# Patient Record
Sex: Male | Born: 1990 | Race: White | Hispanic: No | Marital: Single | Smoking: Never smoker
Health system: Southern US, Community
[De-identification: ages and names within clinical notes are randomized; demographics above are authoritative.]

---

## 2018-06-24 ENCOUNTER — Other Ambulatory Visit: Payer: Self-pay

## 2018-06-24 ENCOUNTER — Emergency Department
Admission: EM | Admit: 2018-06-24 | Discharge: 2018-06-24 | Disposition: A | Discharge status: Other Acute Inpatient Hospital | Payer: Self-pay | Attending: Emergency Medicine | Admitting: Emergency Medicine

## 2018-06-24 ENCOUNTER — Emergency Department: Payer: Self-pay

## 2018-06-24 ENCOUNTER — Encounter: Payer: Self-pay | Admitting: Emergency Medicine

## 2018-06-24 DIAGNOSIS — R52 Pain, unspecified: Secondary | ICD-10-CM

## 2018-06-24 DIAGNOSIS — S80212A Abrasion, left knee, initial encounter: Secondary | ICD-10-CM | POA: Insufficient documentation

## 2018-06-24 DIAGNOSIS — Y929 Unspecified place or not applicable: Secondary | ICD-10-CM | POA: Insufficient documentation

## 2018-06-24 DIAGNOSIS — Y9351 Activity, roller skating (inline) and skateboarding: Secondary | ICD-10-CM | POA: Insufficient documentation

## 2018-06-24 DIAGNOSIS — Y999 Unspecified external cause status: Secondary | ICD-10-CM | POA: Insufficient documentation

## 2018-06-24 DIAGNOSIS — S82142A Displaced bicondylar fracture of left tibia, initial encounter for closed fracture: Secondary | ICD-10-CM | POA: Insufficient documentation

## 2018-06-24 LAB — CBC WITH DIFFERENTIAL/PLATELET
BASOS ABS: 0 10*3/uL (ref 0–0.1)
BASOS PCT: 0 %
EOS ABS: 0 10*3/uL (ref 0–0.7)
Eosinophils Relative: 0 %
HCT: 40.1 % (ref 40.0–52.0)
Hemoglobin: 13.9 g/dL (ref 13.0–18.0)
Lymphocytes Relative: 7 %
Lymphs Abs: 1.2 10*3/uL (ref 1.0–3.6)
MCH: 32 pg (ref 26.0–34.0)
MCHC: 34.6 g/dL (ref 32.0–36.0)
MCV: 92.5 fL (ref 80.0–100.0)
MONOS PCT: 8 %
Monocytes Absolute: 1.4 10*3/uL — ABNORMAL HIGH (ref 0.2–1.0)
NEUTROS ABS: 14.7 10*3/uL — AB (ref 1.4–6.5)
Neutrophils Relative %: 85 %
Platelets: 253 10*3/uL (ref 150–440)
RBC: 4.33 MIL/uL — ABNORMAL LOW (ref 4.40–5.90)
RDW: 13.2 % (ref 11.5–14.5)
WBC: 17.3 10*3/uL — ABNORMAL HIGH (ref 3.8–10.6)

## 2018-06-24 LAB — BASIC METABOLIC PANEL
ANION GAP: 10 (ref 5–15)
BUN: 12 mg/dL (ref 6–20)
CALCIUM: 9.1 mg/dL (ref 8.9–10.3)
CO2: 21 mmol/L — AB (ref 22–32)
CREATININE: 0.87 mg/dL (ref 0.61–1.24)
Chloride: 108 mmol/L (ref 98–111)
GFR calc Af Amer: >60 mL/min (ref >60)
Glucose, Bld: 106 mg/dL — ABNORMAL HIGH (ref 70–99)
Potassium: 3.6 mmol/L (ref 3.5–5.1)
SODIUM: 139 mmol/L (ref 135–145)

## 2018-06-24 MED ORDER — ONDANSETRON HCL 4 MG/2ML IJ SOLN
4.0000 mg | Freq: Once | INTRAMUSCULAR | Status: AC
  Administered 2018-06-24: 4 mg via INTRAVENOUS
  Filled 2018-06-24: qty 2

## 2018-06-24 MED ORDER — HYDROMORPHONE HCL 1 MG/ML IJ SOLN
0.5000 mg | Freq: Once | INTRAMUSCULAR | Status: AC
  Administered 2018-06-24: 0.5 mg via INTRAVENOUS
  Filled 2018-06-24: qty 1

## 2018-06-24 MED ORDER — SODIUM CHLORIDE 0.9 % IV BOLUS
1000.0000 mL | Freq: Once | INTRAVENOUS | Status: AC
  Administered 2018-06-24: 1000 mL via INTRAVENOUS

## 2018-06-24 MED ORDER — HYDROMORPHONE HCL 1 MG/ML IJ SOLN
1.0000 mg | Freq: Once | INTRAMUSCULAR | Status: AC
  Administered 2018-06-24: 1 mg via INTRAVENOUS
  Filled 2018-06-24: qty 1

## 2018-06-24 MED ORDER — HYDROMORPHONE HCL 1 MG/ML IJ SOLN
1.0000 mg | Freq: Once | INTRAMUSCULAR | Status: DC
  Filled 2018-06-24: qty 1

## 2018-06-24 MED ORDER — MORPHINE SULFATE (PF) 4 MG/ML IV SOLN
4.0000 mg | Freq: Once | INTRAVENOUS | Status: AC
  Administered 2018-06-24: 4 mg via INTRAVENOUS
  Filled 2018-06-24: qty 1

## 2018-06-24 MED ORDER — SODIUM CHLORIDE 0.9 % IV BOLUS: 1000.0000 mL | Freq: Once | INTRAVENOUS | Status: DC

## 2018-06-24 MED ORDER — FENTANYL CITRATE (PF) 100 MCG/2ML IJ SOLN
50.0000 ug | Freq: Once | INTRAMUSCULAR | Status: AC
  Administered 2018-06-24: 50 ug via INTRAVENOUS
  Filled 2018-06-24: qty 2

## 2018-06-24 NOTE — ED Notes (Signed)
Called [facility] for transport  1550

## 2018-06-24 NOTE — ED Notes (Signed)
Called CT, kristen, to [facility] images  1524 

## 2018-06-24 NOTE — ED Provider Notes (Signed)
[facility] Emergency Department Provider Note ____________________________________________   I have reviewed the triage vital signs and the triage nursing note.  HISTORY  Chief Complaint Knee Injury and Fall   Historian Patient  HPI Gary Curtis is a 27 y.o. male presents after skateboarding accident landing on left knee, pain is severe at the left knee, unable to walk.  No head or neck injury.  No chest pain or trouble breathing.  Some abrasions to the arms.  No other bony injury.  Happened just prior to arrival.  Arrived by EMS.  Received pain medicine which helped somewhat, but still in significant/severe pain.     History reviewed. No pertinent past medical history.  There are no active problems to display for this patient.   History reviewed. No pertinent surgical history.  Prior to Admission medications   Not on File    No Known Allergies  No family history on file.  Social History Social History   Tobacco Use  . Smoking status: Never Smoker  . Smokeless tobacco: Never Used  Substance Use Topics  . Alcohol use: Not on file  . Drug use: Yes    Types: Marijuana    Review of Systems  Constitutional: Negative for fever. Eyes: Negative for visual changes. ENT: Negative for sore throat. Cardiovascular: Negative for chest pain. Respiratory: Negative for shortness of breath. Gastrointestinal: Negative for abdominal pain, vomiting and diarrhea. Genitourinary: Negative for dysuria. Musculoskeletal: Negative for back pain.  Negative for neck pain.  Positive for left knee pain as per HPI. Skin: Negative for rash. Neurological: Negative for headache.  ____________________________________________   PHYSICAL EXAM:  VITAL SIGNS: ED Triage Vitals  Enc Vitals Group     BP      Pulse      Resp      Temp      Temp src      SpO2      Weight      Height      Head Circumference      Peak Flow      Pain Score      Pain Loc       Pain Edu?      Excl. in GC?      Constitutional: Alert and oriented.  HEENT      Head: Normocephalic and atraumatic.      Eyes: Conjunctivae are normal. Pupils equal and round.       Ears:         Nose: No congestion/rhinnorhea.      Mouth/Throat: Mucous membranes are moist.      Neck: No stridor. Cardiovascular/Chest: Normal rate, regular rhythm.  No murmurs, rubs, or gallops. Respiratory: Normal respiratory effort without tachypnea nor retractions. Breath sounds are clear and equal bilaterally. No wheezes/rales/rhonchi. Gastrointestinal: Soft. No distention, no guarding, no rebound. Nontender.    Genitourinary/rectal:Deferred Musculoskeletal: Pelvis stable.  Left knee and splint.  Abrasion over the top of the left knee.  Swelling and tenderness around the anterior tibia area without significant deformity.  Neurovascularly intact in left lower extremity.  Sensation intact. Neurologic:  Normal speech and language. No gross or focal neurologic deficits are appreciated. Skin:  Skin is warm, dry and intact. No rash noted. Psychiatric: Mood and affect are normal. Speech and behavior are normal. Patient exhibits appropriate insight and judgment.   ____________________________________________  LABS (pertinent positives/negatives) I, [name], MD the attending physician have reviewed the labs noted below.  Labs Reviewed  BASIC METABOLIC PANEL -  Abnormal; Notable for the following components:      Result Value   CO2 21 (*)    Glucose, Bld 106 (*)    All other components within normal limits  CBC WITH DIFFERENTIAL/PLATELET - Abnormal; Notable for the following components:   WBC 17.3 (*)    RBC 4.33 (*)    Neutro Abs 14.7 (*)    Monocytes Absolute 1.4 (*)    All other components within normal limits    ____________________________________________    EKG I, Governor Rooksebecca Nil Bolser, MD, the attending physician have personally viewed and interpreted all  ECGs.  None ____________________________________________  RADIOLOGY   Left knee complete, viewed by myself, radiologist interpretation reviewed: Left tibial plateau fracture  CT left knee: IMPRESSION: 1. Severely comminuted lateral tibial plateau fracture with 3.4 cm right-left distraction and 3.7 cm anterior-posterior distraction at the articular surface. Lateral femoral condyle is impacted into the lateral tibial plateau with mild lateral subluxation of tibia. Fracture involves the medial and lateral tibial eminence. Posterolateral joint capsule appears injured. 2. Recommend MRI of the left knee for evaluation of the ligamentous structures. __________________________________________  PROCEDURES  Procedure(s) performed:  Splint applied by ED tech and nurse.  Location left leg.  Left in position of comfort with EMS posterior splint and padding/buttress laterally to help with stability.  Neurovascularly intact before and after splinting, checked by myself.  Procedures  Critical Care performed: None   ____________________________________________  ED COURSE / ASSESSMENT AND PLAN  Pertinent labs & imaging results that were available during my care of the patient were reviewed by me and considered in my medical decision making (see chart for details).    Concern for fracture about the left knee clinically, provided IV pain control.  X-ray confirms comminuted and displaced proximal tibia fracture with comminuted tibial plateau component.  No additional concern for traumatic injuries.  Obtaining CT scan for further evaluation.  CT scan shows very comminuted fracture with lateral and medial tibial plateaus affected.  After discussion with Dr. Ernest PineHooten here in the emergency department reviewing the films, he is concerned the patient may need fixation and trauma/Ortho for this complicated fix.  Patient required repeat doses of IV pain medicine.  Brief periods of hypotension after  pain medication I suspect are related to that.  He has had no abdominal pain.  No chest pain.  Does not report any other injuries.   CONSULTATIONS: Orthopedics Dr. Ernest PineHooten - recommends CT for further eval. [facility]hl[facility] ED [facility].  I spoke with Dr. Lavonia DraftsScholer, who accepts in transfer ED to ED.   Patient / Family / Caregiver informed of clinical course, medical decision-making process, and agree with plan.    ___________________________________________   FINAL CLINICAL IMPRESSION(S) / ED DIAGNOSES   Final diagnoses:  Abrasion, left knee, initial encounter  Tibial plateau fracture, left, closed, initial encounter      ___________________________________________         Note: This dictation was prepared with Dragon dictation. Any transcriptional errors that result from this process are unintentional    Governor RooksLord, Moet Mikulski, MD 06/24/18 1550

## 2018-06-24 NOTE — ED Notes (Signed)
Per Dr. Shaune PollackLord, hold dilaudid for now until BP can improve, give fentanyl and start fluids.

## 2018-06-24 NOTE — ED Triage Notes (Signed)
Pt arrived via EMS s/p skateboard accident falling onto left knee.  Deformity present.  Pt has splint in place from EMS  Pt had fentanyl total with EMS and fluid bolus started.

## 2018-06-24 NOTE — ED Notes (Signed)
Splint applied to left knee by Lorin PicketScott EDT, used gauze and arm splints for peds and adults to mobilize knee, splint from EMS maintained.  Pulses and sensation intact post splint application.  Dr. Shaune PollackLord in room to visualize splint after application.

## 2018-06-24 NOTE — ED Notes (Signed)
Pt sweating and c/o nausea and pain, family worried about patient's blood pressure, reassured pt and family blood pressure is being closely monitored and pt will be getting fluid.  Tried to reposition pt and make him more comfortable.  VSS discussed with Dr. Shaune PollackLord.

## 2018-06-24 NOTE — ED Notes (Signed)
Returned from CT.

## 2018-06-24 NOTE — ED Notes (Signed)
Patient transported to CT 

## 2018-06-24 NOTE — ED Notes (Signed)
Called CT, kristen, to [employer] [facility]  1524

## 2019-06-04 IMAGING — CT CT KNEE*L* W/O CM
3 of 4 series · 14 of 33 positions shown, 17 images · non-contrast
Comparison: None.

CLINICAL DATA: Skateboarding accident.  Left knee pain.

EXAM:
CT OF THE LEFT KNEE WITHOUT CONTRAST
TECHNIQUE: Multidetector CT imaging of the LEFT knee was performed according to
the standard protocol. Multiplanar CT image reconstructions were
also generated.

[Series 5: axial st · axial · 0.51mm/px · z∈[+375,+540]mm · 8 of 130 slices shown, 10 images]
[im 10/130  soft-tissue]
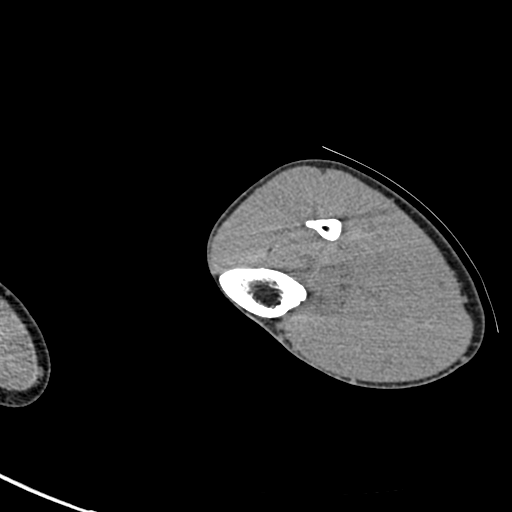
[im 10/130  bone]
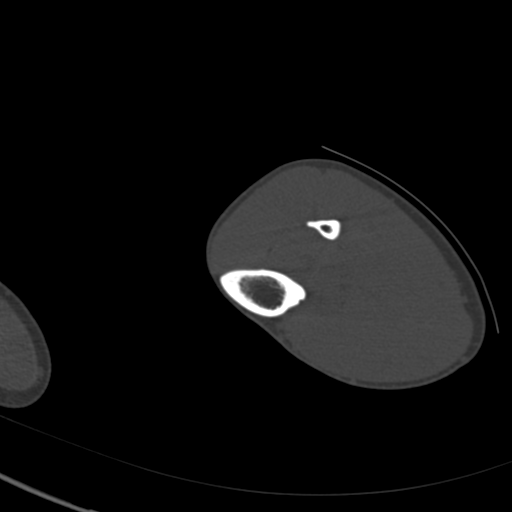
[im 30/130  bone]
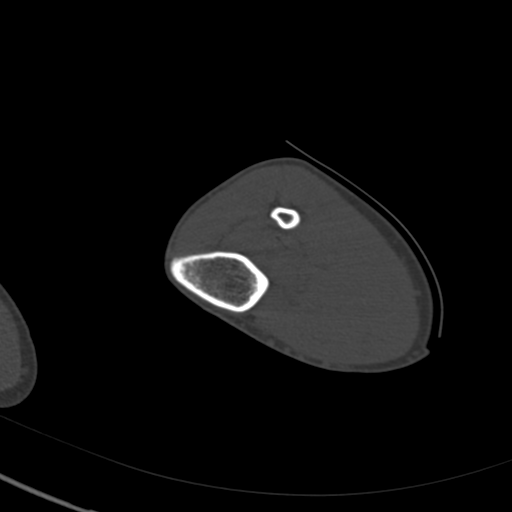
[im 40/130  bone]
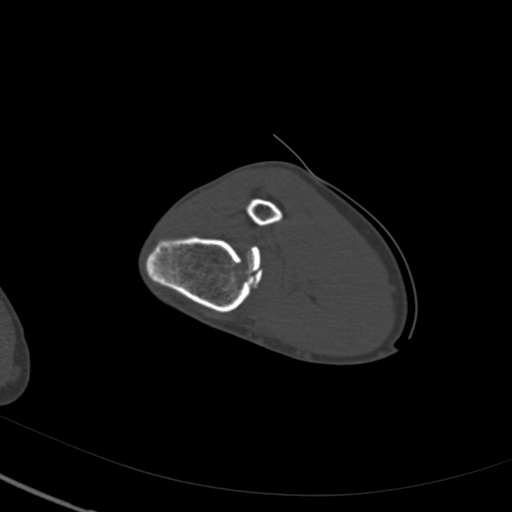
[im 60/130  bone]
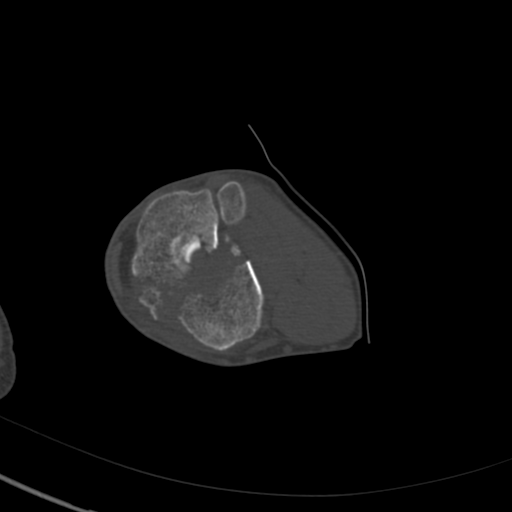
[im 70/130  soft-tissue]
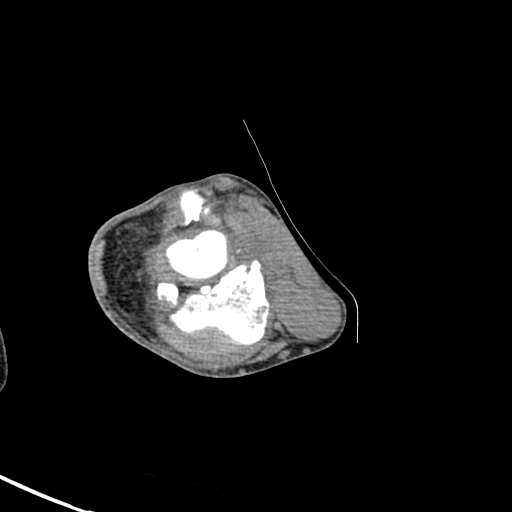
[im 70/130  bone]
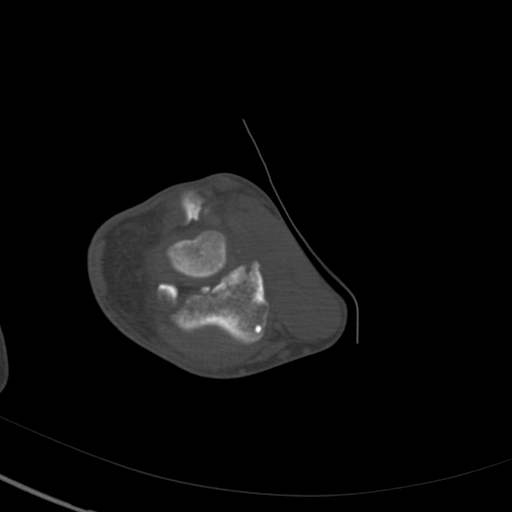
[im 90/130  bone]
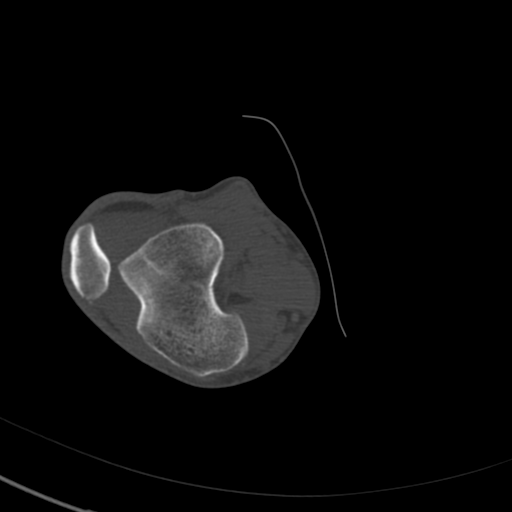
[im 100/130  bone]
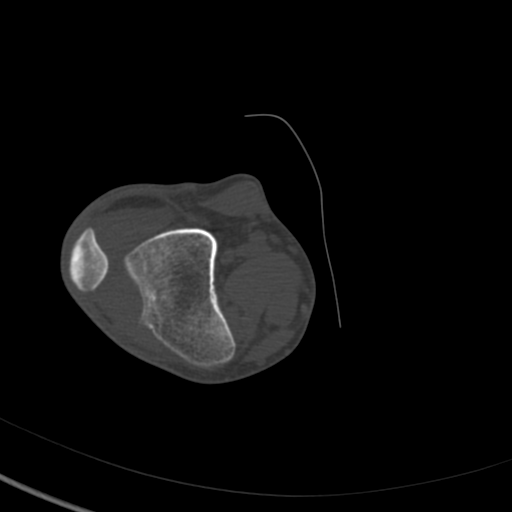
[im 120/130  bone]
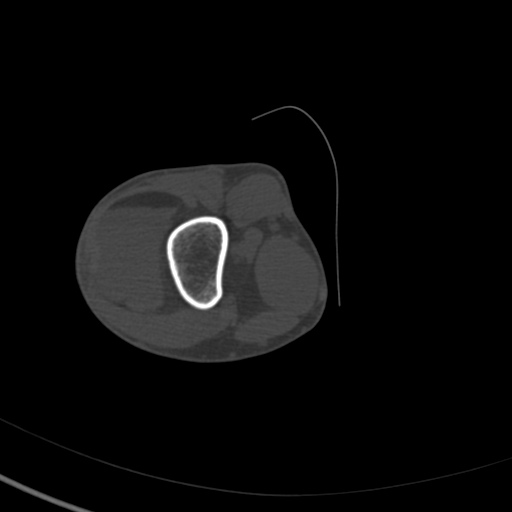

[Series 6: coronal bone · coronal · 0.30mm/px · 1 of 87 slices shown]
[im 44/87  bone]
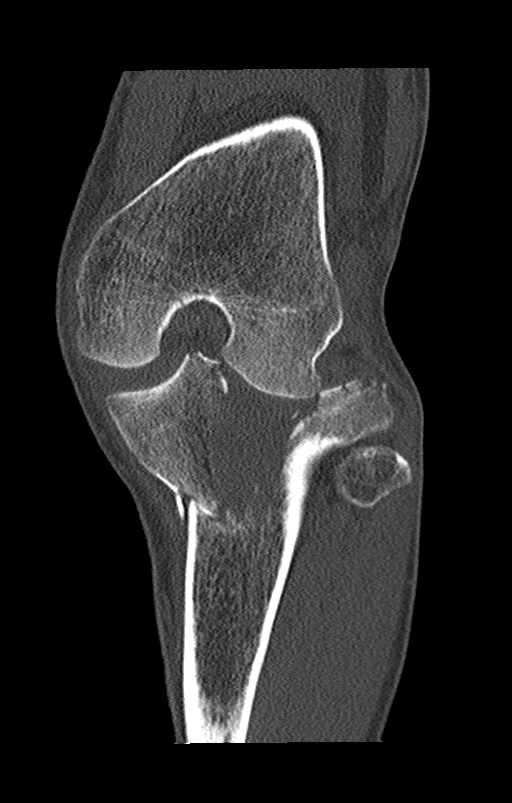

[Series 9: sagittal st · sagittal · 0.43mm/px · 5 of 78 slices shown, 6 images]
[im 26/78  bone]
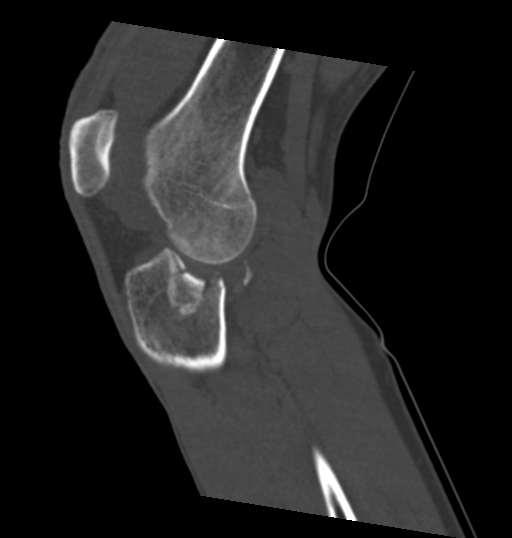
[im 33/78  bone]
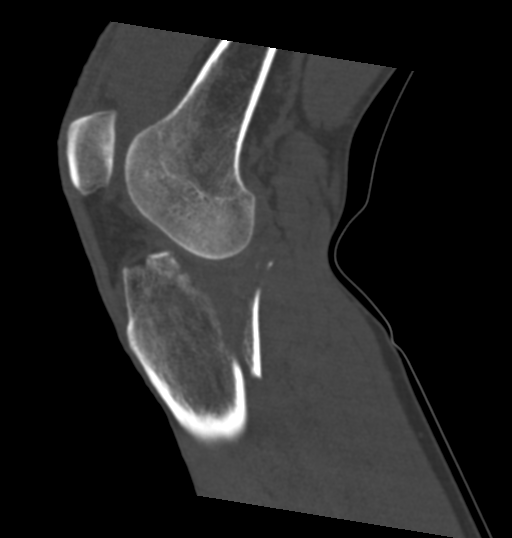
[im 39/78  soft-tissue]
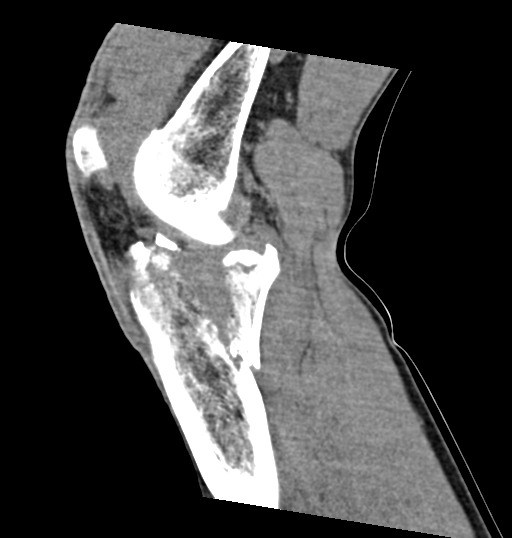
[im 39/78  bone]
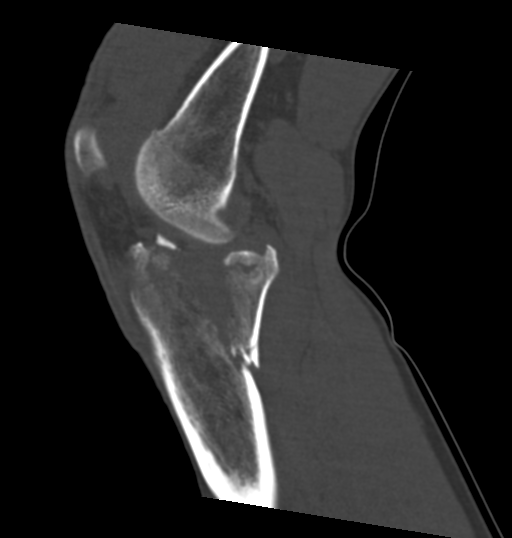
[im 45/78  bone]
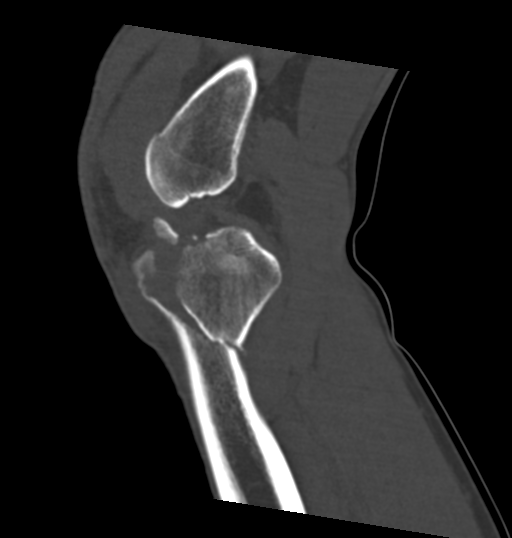
[im 52/78  bone]
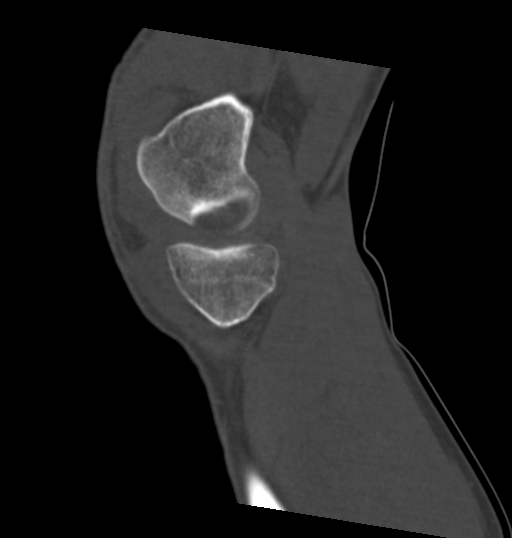

[14 of 33 positions shown; findings below may reference images not displayed]

FINDINGS: Bones/Joint/Cartilage

Severely comminuted lateral tibial plateau fracture with 3.4 cm
right-left distraction and 3.7 cm anterior-posterior distraction at
the articular surface. Lateral femoral condyle is impacted into the
lateral tibial plateau with mild lateral subluxation of tibia.
Fracture involves the medial and lateral tibial eminence.
Posterolateral joint capsule appears injured.

No other acute fracture or dislocation.  Large hemarthrosis.

No aggressive osseous lesion.

Ligaments

Suboptimally assessed by CT.

Muscles and Tendons

Muscles are normal.  No muscle atrophy.

Soft tissues

No soft tissue mass.  No fluid collection or hematoma.
IMPRESSION: 1. Severely comminuted lateral tibial plateau fracture with 3.4 cm
right-left distraction and 3.7 cm anterior-posterior distraction at
the articular surface. Lateral femoral condyle is impacted into the
lateral tibial plateau with mild lateral subluxation of tibia.
Fracture involves the medial and lateral tibial eminence.
Posterolateral joint capsule appears injured.
2. Recommend MRI of the left knee for evaluation of the ligamentous
structures.

## 2019-06-04 IMAGING — DX DG KNEE 1-2V*L*
2 series · 2 of 2 positions shown · non-contrast
Comparison: None.

CLINICAL DATA: Fell today with knee pain and deformity.

EXAM:
LEFT KNEE - 1-2 VIEW

[knee ap]
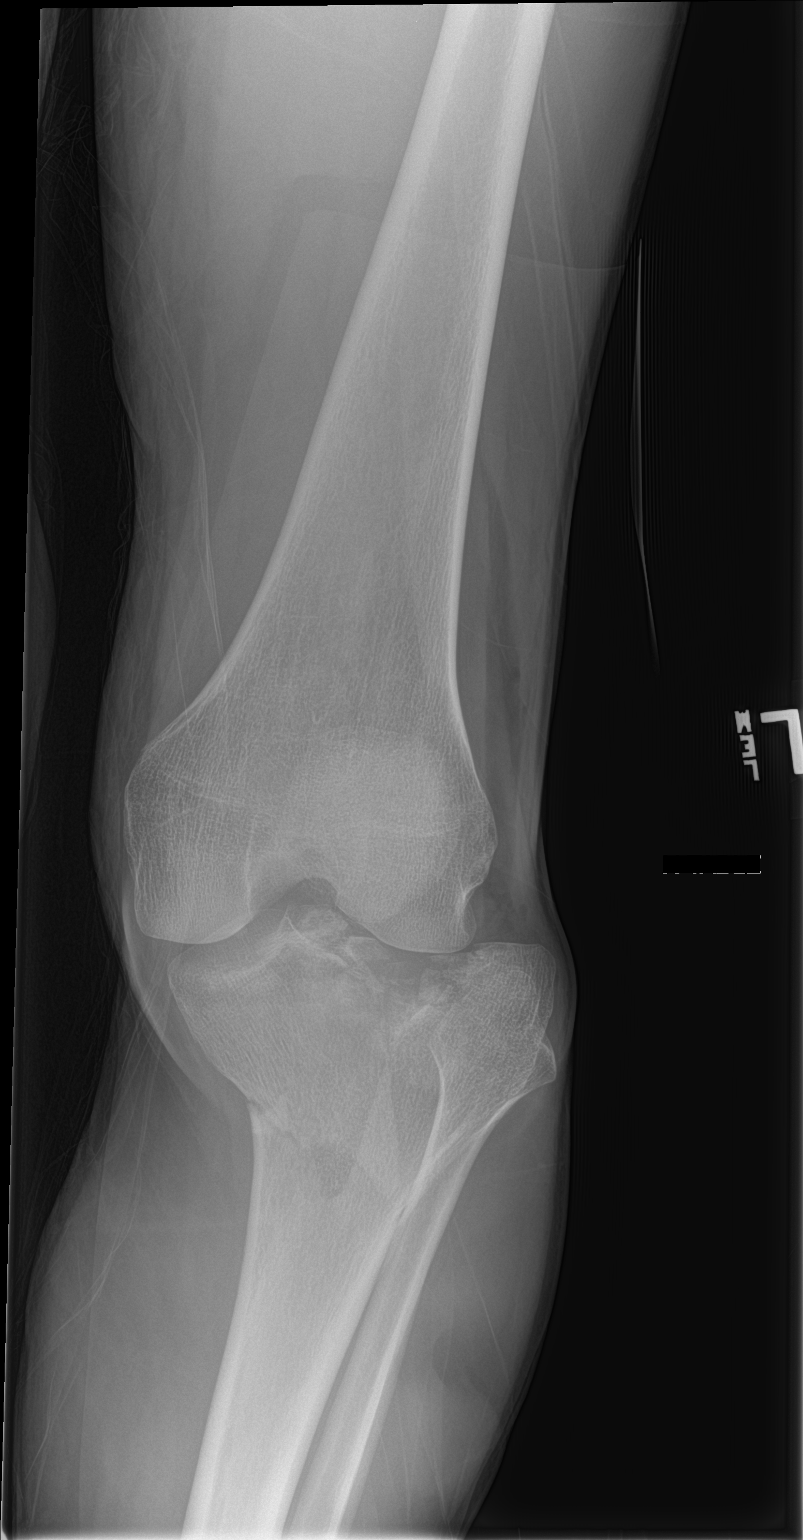

[knee lat]
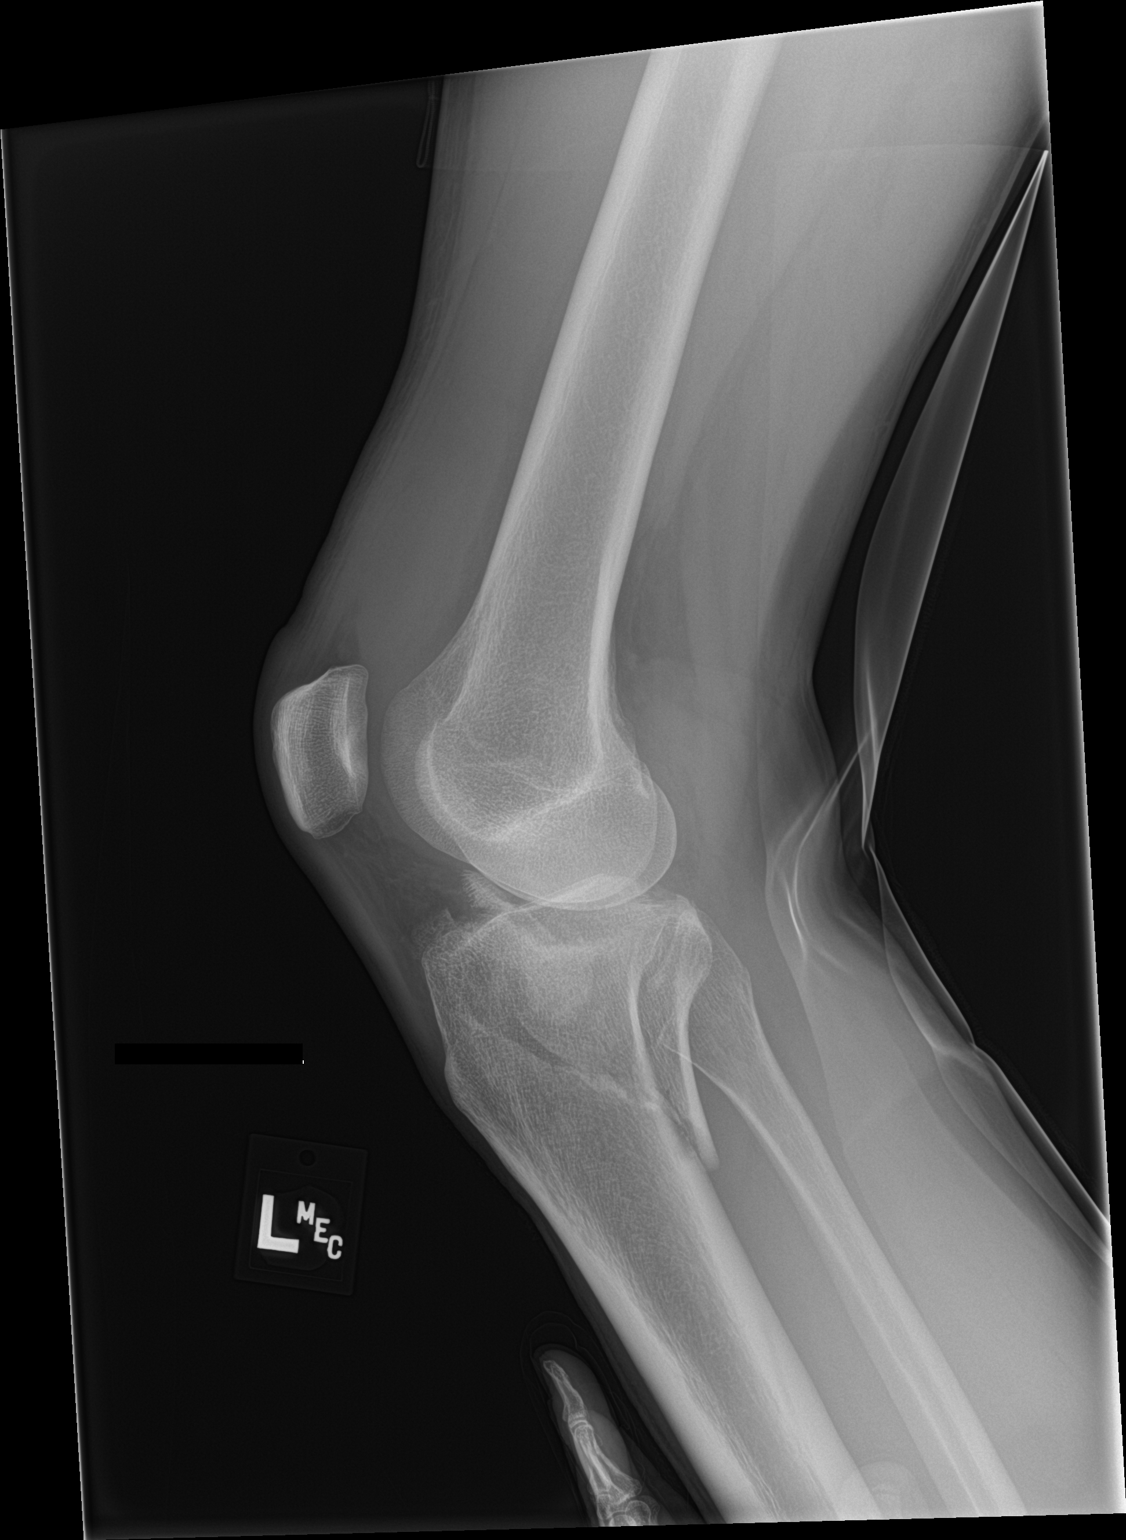

[2 of 2 positions shown; findings below may reference images not displayed]

FINDINGS: Large knee joint effusion. Distal femur and patella are normal.
Comminuted displaced fracture of the proximal femur extending to the
articular surface of the lateral tibial plateau. Depression
comminution of the lateral tibial plateau. Medial tibial plateau
region is encompassed by a displaced fragment.
IMPRESSION: Comminuted displaced proximal tibial fracture including a comminuted
depressed lateral tibial plateau component.
